# Patient Record
Sex: Male | Born: 1985 | Race: White | Hispanic: No | Marital: Single | Smoking: Current every day smoker
Health system: Southern US, Community
[De-identification: ages and names within clinical notes are randomized; demographics above are authoritative.]

---

## 2010-01-12 ENCOUNTER — Inpatient Hospital Stay: Payer: Self-pay | Admitting: Internal Medicine

## 2010-05-09 ENCOUNTER — Emergency Department: Payer: Self-pay | Admitting: Emergency Medicine

## 2010-12-16 ENCOUNTER — Emergency Department: Payer: Self-pay | Admitting: Unknown Physician Specialty

## 2011-01-23 ENCOUNTER — Inpatient Hospital Stay: Payer: Self-pay | Admitting: Otolaryngology

## 2012-10-23 ENCOUNTER — Emergency Department: Payer: Self-pay | Admitting: Emergency Medicine

## 2013-08-19 ENCOUNTER — Emergency Department: Payer: Self-pay | Admitting: Emergency Medicine

## 2013-08-19 LAB — URINALYSIS, COMPLETE
BILIRUBIN, UR: NEGATIVE
BLOOD: NEGATIVE
Bacteria: NONE SEEN
GLUCOSE, UR: NEGATIVE mg/dL (ref 0–75)
KETONE: NEGATIVE
Nitrite: NEGATIVE
PH: 7 (ref 4.5–8.0)
Protein: NEGATIVE
RBC,UR: 1 /HPF (ref 0–5)
SPECIFIC GRAVITY: 1.02 (ref 1.003–1.030)
Squamous Epithelial: <1

## 2013-08-19 LAB — GC/CHLAMYDIA PROBE AMP

## 2014-06-06 ENCOUNTER — Emergency Department
Admit: 2014-06-06 | Disposition: A | Discharge status: Home / Self Care | Payer: Self-pay | Admitting: Physician Assistant

## 2015-01-15 ENCOUNTER — Emergency Department
Admission: EM | Admit: 2015-01-15 | Discharge: 2015-01-15 | Disposition: A | Discharge status: Home / Self Care | Payer: Self-pay | Attending: Emergency Medicine | Admitting: Emergency Medicine

## 2015-01-15 DIAGNOSIS — K029 Dental caries, unspecified: Secondary | ICD-10-CM | POA: Insufficient documentation

## 2015-01-15 DIAGNOSIS — K047 Periapical abscess without sinus: Secondary | ICD-10-CM | POA: Insufficient documentation

## 2015-01-15 DIAGNOSIS — F17200 Nicotine dependence, unspecified, uncomplicated: Secondary | ICD-10-CM | POA: Insufficient documentation

## 2015-01-15 MED ORDER — OXYCODONE-ACETAMINOPHEN 5-325 MG PO TABS: 1.0000 | ORAL_TABLET | Freq: Four times a day (QID) | ORAL | Status: DC | PRN

## 2015-01-15 MED ORDER — CLINDAMYCIN PHOSPHATE 900 MG/6ML IJ SOLN
600.0000 mg | Freq: Once | INTRAMUSCULAR | Status: AC
  Administered 2015-01-15: 600 mg via INTRAMUSCULAR
  Filled 2015-01-15: qty 6

## 2015-01-15 MED ORDER — IBUPROFEN 800 MG PO TABS: 800.0000 mg | ORAL_TABLET | Freq: Three times a day (TID) | ORAL | Status: AC | PRN

## 2015-01-15 MED ORDER — OXYCODONE-ACETAMINOPHEN 5-325 MG PO TABS
1.0000 | ORAL_TABLET | Freq: Once | ORAL | Status: AC
  Administered 2015-01-15: 1 via ORAL
  Filled 2015-01-15: qty 1

## 2015-01-15 MED ORDER — CLINDAMYCIN HCL 150 MG PO CAPS: 450.0000 mg | ORAL_CAPSULE | Freq: Four times a day (QID) | ORAL | Status: AC

## 2015-01-15 NOTE — ED Provider Notes (Signed)
CSN: [id number]     Arrival date & time 01/15/15  1727 History   First MD Initiated Contact with Patient 01/15/15 1759     Chief Complaint  Patient presents with  . Dental Pain     (Consider location/radiation/quality/duration/timing/severity/associated sxs/prior Treatment) HPI  29 year old male presents to emergency department for evaluation of dental pain. Patient states he's had chronic dental infections, last infection was one year ago, needing admission. Patient's current symptoms began 2 days ago, he developed right-sided facial pain and swelling. He denies any current fevers but has had subjective fevers earlier today. Patient is tolerating by mouth well. Patient's pain is moderate to severe. He is not taking any medications. He has not been on recent antibiotics. He does not have a dentist.  History reviewed. No pertinent past medical history. History reviewed. No pertinent past surgical history. No family history on file. Social History  Substance Use Topics  . Smoking status: Current Every Day Smoker  . Smokeless tobacco: None  . Alcohol Use: No    Review of Systems  Constitutional: Negative.  Negative for fever, chills, activity change and appetite change.  HENT: Positive for dental problem. Negative for congestion, ear pain, mouth sores, rhinorrhea, sinus pressure, sore throat and trouble swallowing.   Eyes: Negative for photophobia, pain and discharge.  Respiratory: Negative for cough, chest tightness and shortness of breath.   Cardiovascular: Negative for chest pain and leg swelling.  Gastrointestinal: Negative for nausea, vomiting, abdominal pain, diarrhea and abdominal distention.  Genitourinary: Negative for dysuria and difficulty urinating.  Musculoskeletal: Negative for back pain, arthralgias and gait problem.  Skin: Negative for color change and rash.  Neurological: Negative for dizziness and headaches.  Hematological: Negative for adenopathy.    Psychiatric/Behavioral: Negative for behavioral problems and agitation.      Allergies  Review of patient's allergies indicates no known allergies.  Home Medications   Prior to Admission medications   Medication Sig Start Date End Date Taking? Authorizing Provider  clindamycin (CLEOCIN) 150 MG capsule Take 3 capsules (450 mg total) by mouth 4 (four) times daily. 01/15/15 01/25/15  Evon Slackhomas C Gaines, PA-C  ibuprofen (ADVIL,MOTRIN) 800 MG tablet Take 1 tablet (800 mg total) by mouth every 8 (eight) hours as needed. 01/15/15   Evon Slackhomas C Gaines, PA-C  oxyCODONE-acetaminophen (ROXICET) 5-325 MG tablet Take 1-2 tablets by mouth every 6 (six) hours as needed for severe pain. 01/15/15   Evon Slackhomas C Gaines, PA-C   BP 127/89 mmHg  Pulse 91  Temp(Src) 99.7 F (37.6 C) (Oral)  Resp 18  Ht 5\' 8"  (1.727 m)  Wt 58.968 kg  BMI 19.77 kg/m2  SpO2 95% Physical Exam  Constitutional: He is oriented to person, place, and time. He appears well-developed and well-nourished. No distress.  HENT:  Head: Normocephalic and atraumatic.  Right Ear: External ear normal.  Left Ear: External ear normal.  Nose: Nose normal.  Mouth/Throat: Uvula is midline and oropharynx is clear and moist. No oral lesions. No trismus in the jaw. Normal dentition. Dental abscesses and dental caries present. No uvula swelling. No oropharyngeal exudate, posterior oropharyngeal edema or posterior oropharyngeal erythema.    Eyes: Conjunctivae and EOM are normal. Pupils are equal, round, and reactive to light.  Neck: Normal range of motion. Neck supple.  Cardiovascular: Normal rate, regular rhythm, normal heart sounds and intact distal pulses.  Exam reveals no gallop and no friction rub.   No murmur heard. Pulmonary/Chest: Effort normal. No respiratory distress.  Musculoskeletal: Normal range of motion.  He exhibits no edema or tenderness.  Neurological: He is alert and oriented to person, place, and time.  Skin: Skin is warm and dry.   Psychiatric: He has a normal mood and affect. His behavior is normal. Judgment and thought content normal.    ED Course  Procedures (including critical care time) Labs Review Labs Reviewed - No data to display  Imaging Review No results found. I have personally reviewed and evaluated these images and lab results as part of my medical decision-making.   EKG Interpretation None      MDM   Final diagnoses:  Chronic dental infection    29 year old male with 2 days of right-sided facial pain and dental pain. Vital signs are stable, afebrile. Tolerating by mouth well. On exam no signs of fluctuance, mild soft tissue swelling. We'll give clindamycin 6 mg IM 1. Will be treated with 10 days of clindamycin 450 mg 4 times a day. Ibuprofen 800 mg 3 times a day. Percocet 5-3 25, one tablet by mouth every 4-6 hours when necessary pain quantity #25. Patient will follow-up with dental clinic. Discussed with patient and mother concerns/red flags for returning to the ER for such as increased pain, swelling, fevers, difficulty swallowing.  Evon Slack, PA-C 01/15/15 1858  Evon Slack, PA-C 01/15/15 1859  Jeanmarie Plant, MD 01/15/15 2007

## 2015-01-15 NOTE — Discharge Instructions (Signed)
Periodontal Disease Periodontal disease, or gum disease, is a type of oral disease that affects the surrounding and supporting tissues of the teeth. These include the gums (gingivae), ligaments, and tooth socket (alveolar bone). Periodontal disease can affect one tooth or many teeth. If left untreated, it may lead to tooth loss.  CAUSES The main cause of periodontal disease is dental plaque, which contains harmful bacteria. These bacteria can cause the gums to become inflamed and infected. Further progression of the disease can damage the other supporting tissues.  RISK FACTORS  Diabetes.   Smoking and tobacco use.   Genetics.   Hormonal changes of puberty, menopause, and pregnancy.   Stress.   Clenching or grinding your teeth.   Substance abuse.  Poor nutrition.   Diseases that interfere with the body's immune system.   Certain medicines. SIGNS AND SYMPTOMS  Red or swollen gums.  Bad breath that does not go away.  Gums that have pulled away from the teeth.  Gums that bleed easily.  Permanent teeth that are loose or separating.  Pain when chewing.  Changes in the way your teeth fit together.  Sensitive teeth. DIAGNOSIS  A thorough examination of the periodontal tissues will be done by your dentist. X-rays may be needed. Evaluation of your medical history will be needed to see if there are other factors or underlying conditions that may contribute to the disease. TREATMENT The number and types of treatment will vary depending on the extent of the disease. Treatment may include brushing and flossing only. Further disease progression may necessitate scaling and root planing or even surgery. The main goal is to control the infection. Good oral hygiene at home is necessary for the success of all types of treatment. HOME CARE INSTRUCTIONS   Practice good oral hygiene. This includes flossing and brushing your teeth every day.   See your dentist regularly, at least  2 times per year.   Stop smoking if you smoke.  Eat a well-balanced diet. SEEK IMMEDIATE DENTAL CARE IF:   You have any signs or symptoms of periodontal disease along with:  Swelling of your face, neck, or jaw.  Inability to open your mouth.  Severe pain uncontrolled by pain medicine.  You have a fever or persistent symptoms for more than 2-3 days.  You have a fever and your symptoms suddenly get worse.   This information is not intended to replace advice given to you by your health care provider. Make sure you discuss any questions you have with your health care provider.   Document Released: 02/01/2003 Document Revised: 10/01/2012 Document Reviewed: 07/08/2012 Elsevier Interactive Patient Education [employer]2016 Elsevier Inc.

## 2015-01-15 NOTE — ED Notes (Signed)
Pt c/o right toothache with swelling since wednesday

## 2015-02-22 ENCOUNTER — Encounter: Payer: Self-pay | Admitting: *Deleted

## 2015-02-22 ENCOUNTER — Emergency Department
Admission: EM | Admit: 2015-02-22 | Discharge: 2015-02-22 | Disposition: A | Discharge status: Home / Self Care | Payer: PRIVATE HEALTH INSURANCE | Attending: Emergency Medicine | Admitting: Emergency Medicine

## 2015-02-22 DIAGNOSIS — F17200 Nicotine dependence, unspecified, uncomplicated: Secondary | ICD-10-CM | POA: Diagnosis not present

## 2015-02-22 DIAGNOSIS — L0201 Cutaneous abscess of face: Secondary | ICD-10-CM | POA: Insufficient documentation

## 2015-02-22 DIAGNOSIS — K0499 Other diseases of pulp and periapical tissues: Secondary | ICD-10-CM | POA: Insufficient documentation

## 2015-02-22 DIAGNOSIS — K0381 Cracked tooth: Secondary | ICD-10-CM | POA: Insufficient documentation

## 2015-02-22 LAB — CBC WITH DIFFERENTIAL/PLATELET
BASOS PCT: 1 %
Basophils Absolute: 0.1 10*3/uL (ref 0–0.1)
EOS ABS: 0.1 10*3/uL (ref 0–0.7)
EOS PCT: 1 %
HEMATOCRIT: 38 % — AB (ref 40.0–52.0)
Hemoglobin: 12.8 g/dL — ABNORMAL LOW (ref 13.0–18.0)
Lymphocytes Relative: 15 %
Lymphs Abs: 2.8 10*3/uL (ref 1.0–3.6)
MCH: 30.9 pg (ref 26.0–34.0)
MCHC: 33.7 g/dL (ref 32.0–36.0)
MCV: 91.6 fL (ref 80.0–100.0)
MONO ABS: 1.2 10*3/uL — AB (ref 0.2–1.0)
MONOS PCT: 6 %
Neutro Abs: 15 10*3/uL — ABNORMAL HIGH (ref 1.4–6.5)
Neutrophils Relative %: 77 %
PLATELETS: 336 10*3/uL (ref 150–440)
RBC: 4.15 MIL/uL — ABNORMAL LOW (ref 4.40–5.90)
RDW: 13.7 % (ref 11.5–14.5)
WBC: 19.2 10*3/uL — ABNORMAL HIGH (ref 3.8–10.6)

## 2015-02-22 LAB — BASIC METABOLIC PANEL
Anion gap: 7 (ref 5–15)
BUN: 10 mg/dL (ref 6–20)
CALCIUM: 8.8 mg/dL — AB (ref 8.9–10.3)
CO2: 26 mmol/L (ref 22–32)
CREATININE: 0.69 mg/dL (ref 0.61–1.24)
Chloride: 109 mmol/L (ref 101–111)
GFR calc non Af Amer: >60 mL/min (ref >60)
Glucose, Bld: 86 mg/dL (ref 65–99)
Potassium: 3.8 mmol/L (ref 3.5–5.1)
SODIUM: 142 mmol/L (ref 135–145)

## 2015-02-22 MED ORDER — OXYCODONE-ACETAMINOPHEN 7.5-325 MG PO TABS: 1.0000 | ORAL_TABLET | Freq: Four times a day (QID) | ORAL | Status: DC | PRN

## 2015-02-22 MED ORDER — CLINDAMYCIN PHOSPHATE 600 MG/50ML IV SOLN
600.0000 mg | Freq: Once | INTRAVENOUS | Status: AC
  Administered 2015-02-22: 600 mg via INTRAVENOUS
  Filled 2015-02-22: qty 50

## 2015-02-22 MED ORDER — LIDOCAINE-EPINEPHRINE (PF) 1 %-1:200000 IJ SOLN
INTRAMUSCULAR | Status: AC
  Filled 2015-02-22: qty 30

## 2015-02-22 MED ORDER — HYDROMORPHONE HCL 1 MG/ML IJ SOLN
1.0000 mg | Freq: Once | INTRAMUSCULAR | Status: AC
  Administered 2015-02-22: 1 mg via INTRAMUSCULAR
  Filled 2015-02-22: qty 1

## 2015-02-22 MED ORDER — CLINDAMYCIN HCL 150 MG PO CAPS: 150.0000 mg | ORAL_CAPSULE | Freq: Four times a day (QID) | ORAL | Status: AC

## 2015-02-22 NOTE — ED Notes (Signed)
States he has a tooth abscess to right lower gumline. And also has a large abscess area to jaw area .The patient has a history of of same

## 2015-02-22 NOTE — ED Provider Notes (Signed)
[facility] Emergency Department Provider Note  ____________________________________________  Time seen: Approximately 3:08 PM  I have reviewed the triage vital signs and the nursing notes.   HISTORY  Chief Complaint Abscess and Dental Pain    HPI Martin Curtis is a 30 y.o. male patient complaining of swollen area to the right lower lateral mandible. Patient has a history of fractured and infected teeth has not follow-up with the dentist as directed. Patient stated apply warm compresses to the area and it has risen to the size of a golf ball at this time. Patient state is painful but no other palliative measures taken. Patient rates the pain as a 5/10 describe as painful pressure sensation. No drainage from the area at this time. Patient denies any fever.   History reviewed. No pertinent past medical history.  There are no active problems to display for this patient.   History reviewed. No pertinent past surgical history.  Current Outpatient Rx  Name  Route  Sig  Dispense  Refill  . clindamycin (CLEOCIN) 150 MG capsule   Oral   Take 1 capsule (150 mg total) by mouth 4 (four) times daily.   40 capsule   0   . ibuprofen (ADVIL,MOTRIN) 800 MG tablet   Oral   Take 1 tablet (800 mg total) by mouth every 8 (eight) hours as needed.   30 tablet   0   . oxyCODONE-acetaminophen (PERCOCET) 7.5-325 MG tablet   Oral   Take 1 tablet by mouth every 6 (six) hours as needed for severe pain.   12 tablet   0   . oxyCODONE-acetaminophen (ROXICET) 5-325 MG tablet   Oral   Take 1-2 tablets by mouth every 6 (six) hours as needed for severe pain.   25 tablet   0     Allergies Review of patient's allergies indicates no known allergies.  History reviewed. No pertinent family history.  Social History Social History  Substance Use Topics  . Smoking status: Current Every Day Smoker  . Smokeless tobacco: None  . Alcohol Use: No    Review of  Systems Constitutional: No fever/chills Eyes: No visual changes. ENT: No sore throat. Cardiovascular: Denies chest pain. Respiratory: Denies shortness of breath. Gastrointestinal: No abdominal pain.  No nausea, no vomiting.  No diarrhea.  No constipation. Genitourinary: Negative for dysuria. Musculoskeletal: Negative for back pain. Skin: Negative for rash. Swollen area right lateral mandible Neurological: Negative for headaches, focal weakness or numbness.  10-point ROS otherwise negative.  ____________________________________________   PHYSICAL EXAM:  VITAL SIGNS: ED Triage Vitals  Enc Vitals Group     BP 02/22/15 1331 135/82 mmHg     Pulse Rate 02/22/15 1331 79     Resp 02/22/15 1331 18     Temp 02/22/15 1331 97.8 F (36.6 C)     Temp Source 02/22/15 1331 Oral     SpO2 02/22/15 1331 98 %     Weight 02/22/15 1331 140 lb (63.504 kg)     Height 02/22/15 1331 5\' 8"  (1.727 m)     Head Cir --      Peak Flow --      Pain Score 02/22/15 1331 5     Pain Loc --      Pain Edu? --      Excl. in GC? --     Constitutional: Alert and oriented. Well appearing and in no acute distress. Eyes: Conjunctivae are normal. PERRL. EOMI. Head: Atraumatic. Nose: No congestion/rhinnorhea. Mouth/Throat: Mucous membranes  are moist.  Oropharynx non-erythematous. Fractured and devitalized teeth upper and right lower molar area Neck: No stridor.  No cervical spine tenderness to palpation. Hematological/Lymphatic/Immunilogical: No cervical lymphadenopathy. Cardiovascular: Normal rate, regular rhythm. Grossly normal heart sounds.  Good peripheral circulation. Respiratory: Normal respiratory effort.  No retractions. Lungs CTAB. Gastrointestinal: Soft and nontender. No distention. No abdominal bruits. No CVA tenderness. Musculoskeletal: No lower extremity tenderness nor edema.  No joint effusions. Neurologic:  Normal speech and language. No gross focal neurologic deficits are appreciated. No gait  instability. Skin:  Skin is warm, dry and intact. No rash noted. Abscess right lower lateral mandible area  Psychiatric: Mood and affect are normal. Speech and behavior are normal.  ____________________________________________   LABS (all labs ordered are listed, but only abnormal results are displayed)  Labs Reviewed  CBC WITH DIFFERENTIAL/PLATELET - Abnormal; Notable for the following:    WBC 19.2 (*)    RBC 4.15 (*)    Hemoglobin 12.8 (*)    HCT 38.0 (*)    Neutro Abs 15.0 (*)    Monocytes Absolute 1.2 (*)    All other components within normal limits  BASIC METABOLIC PANEL - Abnormal; Notable for the following:    Calcium 8.8 (*)    All other components within normal limits  WOUND CULTURE   ____________________________________________  EKG   ____________________________________________  RADIOLOGY   ____________________________________________   PROCEDURES  Procedure(s) performed: See procedure note  Critical Care performed: No  ________________INCISION AND DRAINAGE Performed by: Joni Reining Consent: Verbal consent obtained. Risks and benefits: risks, benefits and alternatives were discussed Type: abscess  Body area: Right lower mandible area  Anesthesia: local infiltration  Incision was made with a scalpel.  Local anesthetic: lidocaine 1% with epinephrine  Anesthetic total: 6 ml  Complexity: complex Blunt dissection to break up loculations  Drainage: purulent  Drainage amount: Large Packing material: 1/4 in iodoform gauze Patient tolerance: Patient tolerated the procedure well with no immediate complications.   ____________________________   INITIAL IMPRESSION / ASSESSMENT AND PLAN / ED COURSE  Pertinent labs & imaging results that were available during my care of the patient were reviewed by me and considered in my medical decision making (see chart for details). Cutaneous abscess right lower mandible area. Area was I&D. Patient started  on clindamycin 600 mg IV followed by oral medication for 10 days. Patient advised return back in 2 days for wound check and removal of packing.  ____________________________________________   FINAL CLINICAL IMPRESSION(S) / ED DIAGNOSES  Final diagnoses:  Cutaneous abscess of face      Joni Reining, PA-C 02/22/15 1849  Jeanmarie Plant, MD 02/22/15 2200

## 2015-02-22 NOTE — Discharge Instructions (Signed)
Percutaneous Abscess Drain, Care After °Refer to this sheet in the next few weeks. These instructions provide you with information on caring for yourself after your procedure. Your health care provider may also give you more specific instructions. Your treatment has been planned according to current medical practices, but problems sometimes occur. Call your health care provider if you have any problems or questions after your procedure. °WHAT TO EXPECT AFTER THE PROCEDURE °After your procedure, it is typical to have the following:  °· A small amount of discomfort in the area where the drainage tube was placed. °· A small amount of bruising around the area where the drainage tube was placed. °· Sleepiness and fatigue for the rest of the day from the medicines used. °HOME CARE INSTRUCTIONS °· Rest at home for 1-2 days following your procedure or as directed by your health care provider. °· If you go home right after the procedure, plan to have someone with you for 24 hours. °· Do not take a bath or shower for 24 hours after your procedure. °· Take medicines only as directed by your health care provider. Ask your health care provider when you can resume taking any normal medicines. °· Change bandages (dressings) as directed.   °· You may be told to record the amount of drainage from the bag every time you empty it. Follow your health care provider's directions for emptying the bag. Write down the amount of drainage, the date, and the time you emptied it. °· Call your health care provider when the drain is putting out less than 10 mL of drainage per day for 2-3 days in a row or as directed by your health care provider. °· Follow your health care provider's instructions for cleaning the drainage tube. You may need to clean the tube every day so that it does not clog. °SEEK MEDICAL CARE IF: °· You have increased bleeding (more than a small spot) from the site where the drainage tube was placed. °· You have redness,  swelling, or increasing pain around the site where the drainage tube was placed. °· You notice a discharge or bad smell coming from the site where the drainage tube was placed. °· You have a fever or chills.  °· You have pain that is not helped by medicine.   °SEEK IMMEDIATE MEDICAL CARE IF: °· There is leakage around the drainage tube. °· The drainage tube pulls out. °· You suddenly stop having drainage from the tube. °· You suddenly have blood in the drainage fluid. °· You become dizzy or faint. °· You develop a rash.   °· You have nausea or vomiting. °· You have difficulty breathing, feel short of breath, or feel faint.   °· You develop chest pain. °· You have problems with your speech or vision. °· You have trouble balancing or moving your arms or legs. °  °This information is not intended to replace advice given to you by your health care provider. Make sure you discuss any questions you have with your health care provider. °  °Document Released: 06/15/2013 Document Revised: 11/17/2013 Document Reviewed: 06/15/2013 °Elsevier Interactive Patient Education ©2016 Elsevier Inc. ° °

## 2015-02-22 NOTE — ED Notes (Signed)
Pt states broken tooth on right side, states abscess comes and goes but for several days increased swelling

## 2015-02-24 ENCOUNTER — Emergency Department
Admission: EM | Admit: 2015-02-24 | Discharge: 2015-02-24 | Disposition: A | Discharge status: Home / Self Care | Payer: PRIVATE HEALTH INSURANCE | Attending: Emergency Medicine | Admitting: Emergency Medicine

## 2015-02-24 ENCOUNTER — Encounter: Payer: Self-pay | Admitting: Medical Oncology

## 2015-02-24 DIAGNOSIS — Z792 Long term (current) use of antibiotics: Secondary | ICD-10-CM | POA: Insufficient documentation

## 2015-02-24 DIAGNOSIS — F17200 Nicotine dependence, unspecified, uncomplicated: Secondary | ICD-10-CM | POA: Diagnosis not present

## 2015-02-24 DIAGNOSIS — Z5189 Encounter for other specified aftercare: Secondary | ICD-10-CM

## 2015-02-24 DIAGNOSIS — Z4801 Encounter for change or removal of surgical wound dressing: Secondary | ICD-10-CM | POA: Insufficient documentation

## 2015-02-24 MED ORDER — OXYCODONE-ACETAMINOPHEN 5-325 MG PO TABS
1.0000 | ORAL_TABLET | Freq: Once | ORAL | Status: AC
  Administered 2015-02-24: 1 via ORAL
  Filled 2015-02-24: qty 1

## 2015-02-24 NOTE — Discharge Instructions (Signed)
Return to the emergency room for recheck and packing removal.

## 2015-02-24 NOTE — ED Notes (Signed)
Pt was seen here 2 days ago and had packing placed to face, here for re-check.

## 2015-02-24 NOTE — ED Provider Notes (Signed)
[facility] Emergency Department Provider Note ____________________________________________  Time seen: Approximately 2:32 PM  I have reviewed the triage vital signs and the nursing notes.   HISTORY  Chief Complaint Wound Check    HPI Martin Curtis is a 30 y.o. male is here for a recheck. He was seen here on 1/12 for an abscess to the right side of his face. Area was I&D and packing was placed. Patient states that the area feels much better. He continues to take antibiotics without any difficulty.   History reviewed. No pertinent past medical history.  There are no active problems to display for this patient.   History reviewed. No pertinent past surgical history.  Current Outpatient Rx  Name  Route  Sig  Dispense  Refill  . clindamycin (CLEOCIN) 150 MG capsule   Oral   Take 1 capsule (150 mg total) by mouth 4 (four) times daily.   40 capsule   0   . ibuprofen (ADVIL,MOTRIN) 800 MG tablet   Oral   Take 1 tablet (800 mg total) by mouth every 8 (eight) hours as needed.   30 tablet   0   . oxyCODONE-acetaminophen (PERCOCET) 7.5-325 MG tablet   Oral   Take 1 tablet by mouth every 6 (six) hours as needed for severe pain.   12 tablet   0   . oxyCODONE-acetaminophen (ROXICET) 5-325 MG tablet   Oral   Take 1-2 tablets by mouth every 6 (six) hours as needed for severe pain.   25 tablet   0     Allergies Review of patient's allergies indicates no known allergies.  No family history on file.  Social History Social History  Substance Use Topics  . Smoking status: Current Every Day Smoker  . Smokeless tobacco: None  . Alcohol Use: No    Review of Systems Constitutional: No fever/chills Cardiovascular: Denies chest pain. Respiratory: Denies shortness of breath. Gastrointestinal:  No nausea, no vomiting.  Musculoskeletal: Negative for back pain. Skin: Positive abscess. Neurological: Negative for headaches  10-point ROS otherwise  negative.  ____________________________________________   PHYSICAL EXAM:  VITAL SIGNS: ED Triage Vitals  Enc Vitals Group     BP 02/24/15 1307 124/66 mmHg     Pulse Rate 02/24/15 1307 92     Resp 02/24/15 1307 12     Temp 02/24/15 1307 98 F (36.7 C)     Temp Source 02/24/15 1307 Oral     SpO2 02/24/15 1307 98 %     Weight 02/24/15 1307 140 lb (63.504 kg)     Height 02/24/15 1307 5\' 8"  (1.727 m)     Head Cir --      Peak Flow --      Pain Score --      Pain Loc --      Pain Edu? --      Excl. in GC? --     Constitutional: Alert and oriented. Well appearing and in no acute distress. Eyes: Conjunctivae are normal. PERRL. EOMI. Head: Atraumatic. Nose: No congestion/rhinnorhea. Neck: No stridor.   Cardiovascular: Normal rate, regular rhythm. Grossly normal heart sounds.  Good peripheral circulation. Respiratory: Normal respiratory effort.  No retractions. Lungs CTAB. Gastrointestinal: Soft and nontender. No distention.  Neurologic:  Normal speech and language. No gross focal neurologic deficits are appreciated. No gait instability. Skin:  Skin is warm, dry and intact. No rash noted. Area appears to be healing without any extending cellulitis.   ____________________________________________   LABS (all labs  ordered are listed, but only abnormal results are displayed)  Labs Reviewed - No data to display  PROCEDURES  Procedure(s) performed: Packing was removed without any difficulty. Area was repacked and patient is to return in 2 days for packing removal.  Critical Care performed: No  ____________________________________________   INITIAL IMPRESSION / ASSESSMENT AND PLAN / ED COURSE  Pertinent labs & imaging results that were available during my care of the patient were reviewed by me and considered in my medical decision making (see chart for details).  Patient told to return in 2 days for packing removal. ____________________________________________   FINAL  CLINICAL IMPRESSION(S) / ED DIAGNOSES  Final diagnoses:  Wound check, abscess      Tommi RumpsRhonda L Summers, PA-C 02/24/15 1516  Tommi Rumpshonda L Summers, PA-C 03/09/15 1411  Arnaldo NatalPaul F Malinda, MD 03/18/15 2332

## 2015-02-24 NOTE — ED Notes (Signed)
Assess per PA 

## 2015-02-26 LAB — WOUND CULTURE
Culture: NORMAL
Special Requests: NORMAL

## 2016-12-31 ENCOUNTER — Other Ambulatory Visit: Payer: Self-pay

## 2016-12-31 ENCOUNTER — Emergency Department
Admission: EM | Admit: 2016-12-31 | Discharge: 2016-12-31 | Disposition: A | Discharge status: Home / Self Care | Payer: Commercial Managed Care - HMO | Attending: Emergency Medicine | Admitting: Emergency Medicine

## 2016-12-31 ENCOUNTER — Emergency Department: Payer: Commercial Managed Care - HMO

## 2016-12-31 ENCOUNTER — Encounter: Payer: Self-pay | Admitting: Emergency Medicine

## 2016-12-31 DIAGNOSIS — N132 Hydronephrosis with renal and ureteral calculous obstruction: Secondary | ICD-10-CM | POA: Diagnosis not present

## 2016-12-31 DIAGNOSIS — N200 Calculus of kidney: Secondary | ICD-10-CM

## 2016-12-31 DIAGNOSIS — F17200 Nicotine dependence, unspecified, uncomplicated: Secondary | ICD-10-CM | POA: Insufficient documentation

## 2016-12-31 DIAGNOSIS — R319 Hematuria, unspecified: Secondary | ICD-10-CM | POA: Diagnosis present

## 2016-12-31 LAB — CBC
HCT: 40.8 % (ref 40.0–52.0)
Hemoglobin: 14.3 g/dL (ref 13.0–18.0)
MCH: 32.2 pg (ref 26.0–34.0)
MCHC: 35 g/dL (ref 32.0–36.0)
MCV: 92 fL (ref 80.0–100.0)
Platelets: 411 10*3/uL (ref 150–440)
RBC: 4.43 MIL/uL (ref 4.40–5.90)
RDW: 13 % (ref 11.5–14.5)
WBC: 11.2 10*3/uL — ABNORMAL HIGH (ref 3.8–10.6)

## 2016-12-31 LAB — COMPREHENSIVE METABOLIC PANEL
ALT: 14 U/L — ABNORMAL LOW (ref 17–63)
AST: 19 U/L (ref 15–41)
Albumin: 4 g/dL (ref 3.5–5.0)
Alkaline Phosphatase: 53 U/L (ref 38–126)
Anion gap: 8 (ref 5–15)
BUN: 12 mg/dL (ref 6–20)
CALCIUM: 9.2 mg/dL (ref 8.9–10.3)
CHLORIDE: 106 mmol/L (ref 101–111)
CO2: 25 mmol/L (ref 22–32)
CREATININE: 0.7 mg/dL (ref 0.61–1.24)
Glucose, Bld: 96 mg/dL (ref 65–99)
POTASSIUM: 3.9 mmol/L (ref 3.5–5.1)
SODIUM: 139 mmol/L (ref 135–145)
TOTAL PROTEIN: 7.4 g/dL (ref 6.5–8.1)
Total Bilirubin: 0.6 mg/dL (ref 0.3–1.2)

## 2016-12-31 LAB — URINALYSIS, COMPLETE (UACMP) WITH MICROSCOPIC
BILIRUBIN URINE: NEGATIVE
Bacteria, UA: NONE SEEN
Glucose, UA: NEGATIVE mg/dL
Ketones, ur: NEGATIVE mg/dL
LEUKOCYTES UA: NEGATIVE
NITRITE: NEGATIVE
PH: 5 (ref 5.0–8.0)
Protein, ur: 30 mg/dL — AB
SPECIFIC GRAVITY, URINE: 1.021 (ref 1.005–1.030)

## 2016-12-31 LAB — LIPASE, BLOOD: Lipase: 36 U/L (ref 11–51)

## 2016-12-31 MED ORDER — KETOROLAC TROMETHAMINE 10 MG PO TABS: 10.0000 mg | ORAL_TABLET | Freq: Four times a day (QID) | ORAL | 0 refills | Status: AC | PRN

## 2016-12-31 MED ORDER — TAMSULOSIN HCL 0.4 MG PO CAPS: 0.4000 mg | ORAL_CAPSULE | Freq: Every day | ORAL | 0 refills | Status: AC

## 2016-12-31 NOTE — ED Triage Notes (Signed)
Pt presents with urinary pain, frequency, hematuria, and recent left testicle pain. Urinary symptoms started this morning. He isn't sure that the testicle swelling and pain is related, as it has resolved. Pt alert & oriented with nad noted.

## 2016-12-31 NOTE — ED Provider Notes (Signed)
[facility] Emergency Department Provider Note  Time seen: 7:33 PM  I have reviewed the triage vital signs and the nursing notes.   HISTORY  Chief Complaint Abdominal Pain    HPI Martin Curtis is a 31 y.o. male with no past medical history who presents to the emergency department with hematuria, urinary frequency and left flank pain.  Patient states this morning while driving to work he had sudden onset acute left-sided abdominal pain that radiated into his left testicle.  Patient states he had to urinate very frequently and states mild dysuria when urinating.  States this morning he saw blood in his urine but has not seen any blood since.  Denies any history of kidney stones.  Denies any nausea, vomiting or diarrhea.  Denies any known fever.  History reviewed. No pertinent past medical history.  There are no active problems to display for this patient.   History reviewed. No pertinent surgical history.  Prior to Admission medications   Medication Sig Start Date End Date Taking? Authorizing Provider  clindamycin (CLEOCIN) 150 MG capsule Take 1 capsule (150 mg total) by mouth 4 (four) times daily. 02/22/15   Joni ReiningSmith, Ronald K, PA-C  ibuprofen (ADVIL,MOTRIN) 800 MG tablet Take 1 tablet (800 mg total) by mouth every 8 (eight) hours as needed. 01/15/15   Evon SlackGaines, Thomas C, PA-C  oxyCODONE-acetaminophen (PERCOCET) 7.5-325 MG tablet Take 1 tablet by mouth every 6 (six) hours as needed for severe pain. 02/22/15   Joni ReiningSmith, Ronald K, PA-C  oxyCODONE-acetaminophen (ROXICET) 5-325 MG tablet Take 1-2 tablets by mouth every 6 (six) hours as needed for severe pain. 01/15/15   Evon SlackGaines, Thomas C, PA-C    No Known Allergies  History reviewed. No pertinent family history.  Social History Social History   Tobacco Use  . Smoking status: Current Every Day Smoker    Packs/day: 1.50  . Smokeless tobacco: Never Used  Substance Use Topics  . Alcohol use: No  . Drug use: Yes    Types:  Marijuana    Comment: occasional    Review of Systems Constitutional: Negative for fever. Cardiovascular: Negative for chest pain. Respiratory: Negative for shortness of breath. Gastrointestinal: Positive for left flank pain.  Negative for nausea vomiting or diarrhea Genitourinary: Mild dysuria, positive for hematuria now resolved Musculoskeletal: Left back pain All other ROS negative  ____________________________________________   PHYSICAL EXAM:  VITAL SIGNS: ED Triage Vitals  Enc Vitals Group     BP 12/31/16 1828 120/80     Pulse Rate 12/31/16 1828 91     Resp 12/31/16 1828 18     Temp 12/31/16 1828 99.4 F (37.4 C)     Temp Source 12/31/16 1828 Oral     SpO2 12/31/16 1828 100 %     Weight 12/31/16 1828 140 lb (63.5 kg)     Height 12/31/16 1828 5\' 8"  (1.727 m)     Head Circumference --      Peak Flow --      Pain Score 12/31/16 1827 2     Pain Loc --      Pain Edu? --      Excl. in GC? --    Constitutional: Alert and oriented. Well appearing and in no distress. Eyes: Normal exam ENT   Head: Normocephalic and atraumatic   Mouth/Throat: Mucous membranes are moist. Cardiovascular: Normal rate, regular rhythm. No murmur Respiratory: Normal respiratory effort without tachypnea nor retractions. Breath sounds are clear Gastrointestinal: Soft and nontender. No distention.  No  CVA tenderness Musculoskeletal: Nontender with normal range of motion in all extremities Neurologic:  Normal speech and language. No gross focal neurologic deficits Skin:  Skin is warm, dry and intact.  Psychiatric: Mood and affect are normal.   ____________________________________________   RADIOLOGY  Left 2 mm UVJ stone  ____________________________________________   INITIAL IMPRESSION / ASSESSMENT AND PLAN / ED COURSE  Pertinent labs & imaging results that were available during my care of the patient were reviewed by me and considered in my medical decision making (see chart for  details).  Patient presents to the emergency department for left-sided flank pain with hematuria.  Differential would include ureterolithiasis, urinary tract infection, pyelonephritis.  Patient symptoms are very suggestive of ureterolithiasis.  No history of ureterolithiasis.  We will obtain a noncontrasted CT scan to further evaluate.  Patient's workup is fairly reassuring, labs are within normal limits besides a slightly elevated white blood cell count.  Urinalysis pending.  CT shows 2 mm left UVJ stone. Urinalysis has resulted normal.  We will discharge the patient with oral Toradol, Flomax.  I discussed with the patient drinking plenty of fluids as well as my normal kidneys on return precautions. ____________________________________________   FINAL CLINICAL IMPRESSION(S) / ED DIAGNOSES  Theda SersUreterolithiasis    Martin Sitzman, MD 12/31/16 2106

## 2017-02-12 ENCOUNTER — Other Ambulatory Visit: Payer: Self-pay

## 2017-02-12 ENCOUNTER — Emergency Department
Admission: EM | Admit: 2017-02-12 | Discharge: 2017-02-13 | Disposition: A | Discharge status: Home / Self Care | Payer: Commercial Managed Care - HMO | Attending: Emergency Medicine | Admitting: Emergency Medicine

## 2017-02-12 DIAGNOSIS — K047 Periapical abscess without sinus: Secondary | ICD-10-CM | POA: Diagnosis not present

## 2017-02-12 DIAGNOSIS — F17210 Nicotine dependence, cigarettes, uncomplicated: Secondary | ICD-10-CM | POA: Diagnosis not present

## 2017-02-12 DIAGNOSIS — K0889 Other specified disorders of teeth and supporting structures: Secondary | ICD-10-CM | POA: Diagnosis present

## 2017-02-12 NOTE — ED Notes (Signed)
Pt sitting in flex lobby with no distress noted; pt updated on wait time and delay

## 2017-02-12 NOTE — ED Triage Notes (Signed)
Pt in with co dental abscess since Saturday morning.

## 2017-02-13 ENCOUNTER — Encounter: Payer: Self-pay | Admitting: Emergency Medicine

## 2017-02-13 MED ORDER — CEPHALEXIN 500 MG PO CAPS
500.0000 mg | ORAL_CAPSULE | Freq: Once | ORAL | Status: AC
  Administered 2017-02-13: 500 mg via ORAL
  Filled 2017-02-13: qty 1

## 2017-02-13 MED ORDER — BUPIVACAINE HCL (PF) 0.5 % IJ SOLN
5.0000 mL | Freq: Once | INTRAMUSCULAR | Status: AC
  Administered 2017-02-13: 5 mL
  Filled 2017-02-13: qty 30

## 2017-02-13 MED ORDER — CEPHALEXIN 500 MG PO CAPS: 500.0000 mg | ORAL_CAPSULE | Freq: Four times a day (QID) | ORAL | 0 refills | Status: AC

## 2017-02-13 NOTE — ED Provider Notes (Signed)
[facility] Emergency Department Provider Note   ____________________________________________   First MD Initiated Contact with Patient 02/12/17 2340     (approximate)  I have reviewed the triage vital signs and the nursing notes.   HISTORY  Chief Complaint Abscess    HPI Martin Curtis is a 32 y.o. male who comes into the hospital today with an abscess on his gums.  The patient states that he noticed it about 2 days ago.  He reports that he has multiple broken teeth on his maxillary jaw.  He reports that he has been doing [place] powders and Anbesol but the swelling has gotten worse.  The patient has a dentist and plans to follow-up with him.  The patient rates his pain a 2-3 out of 10 in intensity currently.  He states that he had a temperature to 100 last night.  He is here today for evaluation and treatment of his abscess.  History reviewed. No pertinent past medical history.  There are no active problems to display for this patient.   History reviewed. No pertinent surgical history.  Prior to Admission medications   Medication Sig Start Date End Date Taking? Authorizing Provider  cephALEXin (KEFLEX) 500 MG capsule Take 1 capsule (500 mg total) by mouth 4 (four) times daily for 10 days. 02/13/17 02/23/17  Rebecka Apley, MD  clindamycin (CLEOCIN) 150 MG capsule Take 1 capsule (150 mg total) by mouth 4 (four) times daily. 02/22/15   Joni Reining, PA-C  ibuprofen (ADVIL,MOTRIN) 800 MG tablet Take 1 tablet (800 mg total) by mouth every 8 (eight) hours as needed. 01/15/15   Evon Slack, PA-C  ketorolac (TORADOL) 10 MG tablet Take 1 tablet (10 mg total) every 6 (six) hours as needed by mouth. 12/31/16   Minna Antis, MD  oxyCODONE-acetaminophen (PERCOCET) 7.5-325 MG tablet Take 1 tablet by mouth every 6 (six) hours as needed for severe pain. 02/22/15   Joni Reining, PA-C  oxyCODONE-acetaminophen (ROXICET) 5-325 MG tablet Take 1-2 tablets by  mouth every 6 (six) hours as needed for severe pain. 01/15/15   Evon Slack, PA-C  tamsulosin (FLOMAX) 0.4 MG CAPS capsule Take 1 capsule (0.4 mg total) daily by mouth. 12/31/16   Minna Antis, MD    Allergies Patient has no known allergies.  No family history on file.  Social History Social History   Tobacco Use  . Smoking status: Current Every Day Smoker    Packs/day: 1.50  . Smokeless tobacco: Never Used  Substance Use Topics  . Alcohol use: No  . Drug use: Yes    Types: Marijuana    Comment: occasional    Review of Systems  Constitutional: No fever/chills Eyes: No visual changes. ENT: Dental abscess Cardiovascular: Denies chest pain. Respiratory: Denies shortness of breath. Gastrointestinal: No abdominal pain.  No nausea, no vomiting.  No diarrhea.  No constipation. Genitourinary: Negative for dysuria. Musculoskeletal: Negative for back pain. Skin: Negative for rash. Neurological: Negative for headaches, focal weakness or numbness.   ____________________________________________   PHYSICAL EXAM:  VITAL SIGNS: ED Triage Vitals  Enc Vitals Group     BP 02/12/17 2116 (!) 151/76     Pulse Rate 02/12/17 2116 91     Resp 02/12/17 2116 20     Temp 02/12/17 2116 99 F (37.2 C)     Temp Source 02/12/17 2116 Oral     SpO2 02/12/17 2116 98 %     Weight 02/12/17 2117 145 lb (65.8 kg)  Height 02/12/17 2117 5\' 8"  (1.727 m)     Head Circumference --      Peak Flow --      Pain Score 02/12/17 2116 3     Pain Loc --      Pain Edu? --      Excl. in GC? --     Constitutional: Alert and oriented. Well appearing and in no acute distress. Eyes: Conjunctivae are normal. PERRL. EOMI. Head: Atraumatic. Nose: No congestion/rhinnorhea. Mouth/Throat: Mucous membranes are moist.  Oropharynx non-erythematous.  Soft tissue swelling to left maxillary gumline with visible abscess. Cardiovascular: Normal rate, regular rhythm. Grossly normal heart sounds.  Good  peripheral circulation. Respiratory: Normal respiratory effort.  No retractions. Lungs CTAB. Gastrointestinal: Soft and nontender. No distention.  Positive bowel sounds Musculoskeletal: No lower extremity tenderness nor edema.   Neurologic:  Normal speech and language.  Skin:  Skin is warm, dry and intact.  Psychiatric: Mood and affect are normal.   ____________________________________________   LABS (all labs ordered are listed, but only abnormal results are displayed)  Labs Reviewed - No data to display ____________________________________________  EKG  none ____________________________________________  RADIOLOGY  No results found.  ____________________________________________   PROCEDURES  Procedure(s) performed: please, see procedure note(s).  Marland Kitchen.Incision and Drainage Date/Time: 02/13/2017 12:40 AM Performed by: Rebecka Apley, MD Authorized by: Rebecka Apley, MD   Consent:    Consent obtained:  Verbal   Consent given by:  Patient   Risks discussed:  Incomplete drainage and pain Location:    Type:  Abscess   Location:  Mouth   Mouth location:  Alveolar process Anesthesia (see MAR for exact dosages):    Anesthesia method:  Local infiltration   Local anesthetic:  Bupivacaine 0.5% w/o epi Procedure type:    Complexity:  Simple Procedure details:    Incision types:  Stab incision   Scalpel blade:  11   Drainage:  Purulent   Drainage amount:  Moderate   Packing materials:  None Post-procedure details:    Patient tolerance of procedure:  Tolerated well, no immediate complications     Critical Care performed: No  ____________________________________________   INITIAL IMPRESSION / ASSESSMENT AND PLAN / ED COURSE  As part of my medical decision making, I reviewed the following data within the electronic MEDICAL RECORD NUMBER Notes from prior ED visits and [place] Controlled Substance Database   This is a 32 year old male who comes into the hospital  today with an abscess in his mouth.  The patient has multiple broken teeth and has soft tissue swelling to his maxillary gums.  I will give the patient a dose of Keflex and I did drain the abscess.  I feel that this is from his multiple broken teeth.  I did a quick stab incision with a scalpel and did drain some significant amount of purulent and bloody material.  The patient will be discharged with some antibiotics and instructed to follow-up with his dentist.  I instructed the patient that he should use the mouthwash to help cleanse his mouth and please take the antibiotics.  The patient will be discharged home.      ____________________________________________   FINAL CLINICAL IMPRESSION(S) / ED DIAGNOSES  Final diagnoses:  Dental abscess     ED Discharge Orders        Ordered    cephALEXin (KEFLEX) 500 MG capsule  4 times daily     02/13/17 0111       Note:  This document was  prepared using Conservation officer, historic buildingsDragon voice recognition software and may include unintentional dictation errors.    Rebecka ApleyWebster, Allison P, MD 02/13/17 [id number]

## 2017-02-13 NOTE — Discharge Instructions (Signed)
[facility] or at your own dentist.

## 2018-10-28 ENCOUNTER — Emergency Department: Payer: Self-pay

## 2018-10-28 ENCOUNTER — Emergency Department
Admission: EM | Admit: 2018-10-28 | Discharge: 2018-10-28 | Disposition: A | Discharge status: Home / Self Care | Payer: Self-pay | Attending: Emergency Medicine | Admitting: Emergency Medicine

## 2018-10-28 ENCOUNTER — Encounter: Payer: Self-pay | Admitting: Emergency Medicine

## 2018-10-28 ENCOUNTER — Other Ambulatory Visit: Payer: Self-pay

## 2018-10-28 DIAGNOSIS — Y999 Unspecified external cause status: Secondary | ICD-10-CM | POA: Diagnosis not present

## 2018-10-28 DIAGNOSIS — Y9389 Activity, other specified: Secondary | ICD-10-CM | POA: Diagnosis not present

## 2018-10-28 DIAGNOSIS — R0789 Other chest pain: Secondary | ICD-10-CM | POA: Diagnosis not present

## 2018-10-28 DIAGNOSIS — F17210 Nicotine dependence, cigarettes, uncomplicated: Secondary | ICD-10-CM | POA: Insufficient documentation

## 2018-10-28 DIAGNOSIS — Y92410 Unspecified street and highway as the place of occurrence of the external cause: Secondary | ICD-10-CM | POA: Diagnosis not present

## 2018-10-28 DIAGNOSIS — S060X1A Concussion with loss of consciousness of 30 minutes or less, initial encounter: Secondary | ICD-10-CM | POA: Diagnosis not present

## 2018-10-28 DIAGNOSIS — S0990XA Unspecified injury of head, initial encounter: Secondary | ICD-10-CM | POA: Diagnosis present

## 2018-10-28 MED ORDER — TRAMADOL HCL 50 MG PO TABS: 50.0000 mg | ORAL_TABLET | Freq: Four times a day (QID) | ORAL | 0 refills | Status: AC | PRN

## 2018-10-28 MED ORDER — TRAMADOL HCL 50 MG PO TABS
50.0000 mg | ORAL_TABLET | Freq: Once | ORAL | Status: AC
  Administered 2018-10-28: 13:00:00 50 mg via ORAL
  Filled 2018-10-28: qty 1
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
## 2018-10-28 NOTE — ED Notes (Signed)
Patient transported to X-ray 

## 2018-10-28 NOTE — ED Notes (Signed)
NAD noted at time of D/C. Pt denies questions or concerns. Pt ambulatory to the lobby at this time.  
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
## 2018-11-03 ENCOUNTER — Other Ambulatory Visit: Payer: Self-pay

## 2018-11-03 DIAGNOSIS — Z20822 Contact with and (suspected) exposure to covid-19: Secondary | ICD-10-CM

## 2018-11-05 LAB — NOVEL CORONAVIRUS, NAA: SARS-CoV-2, NAA: NOT DETECTED

## 2019-07-01 ENCOUNTER — Other Ambulatory Visit: Payer: Self-pay

## 2019-07-01 ENCOUNTER — Emergency Department
Admission: EM | Admit: 2019-07-01 | Discharge: 2019-07-01 | Disposition: A | Discharge status: Home / Self Care | Payer: Self-pay | Attending: Emergency Medicine | Admitting: Emergency Medicine

## 2019-07-01 ENCOUNTER — Emergency Department: Payer: Self-pay

## 2019-07-01 ENCOUNTER — Encounter: Payer: Self-pay | Admitting: Emergency Medicine

## 2019-07-01 DIAGNOSIS — W228XXA Striking against or struck by other objects, initial encounter: Secondary | ICD-10-CM | POA: Insufficient documentation

## 2019-07-01 DIAGNOSIS — Y999 Unspecified external cause status: Secondary | ICD-10-CM | POA: Insufficient documentation

## 2019-07-01 DIAGNOSIS — Z79899 Other long term (current) drug therapy: Secondary | ICD-10-CM | POA: Insufficient documentation

## 2019-07-01 DIAGNOSIS — S0083XA Contusion of other part of head, initial encounter: Secondary | ICD-10-CM | POA: Insufficient documentation

## 2019-07-01 DIAGNOSIS — Y939 Activity, unspecified: Secondary | ICD-10-CM | POA: Insufficient documentation

## 2019-07-01 DIAGNOSIS — Y929 Unspecified place or not applicable: Secondary | ICD-10-CM | POA: Insufficient documentation

## 2019-07-01 DIAGNOSIS — S0292XA Unspecified fracture of facial bones, initial encounter for closed fracture: Secondary | ICD-10-CM | POA: Insufficient documentation

## 2019-07-01 DIAGNOSIS — F17200 Nicotine dependence, unspecified, uncomplicated: Secondary | ICD-10-CM | POA: Insufficient documentation

## 2019-07-01 MED ORDER — CEPHALEXIN 500 MG PO CAPS: 500.0000 mg | ORAL_CAPSULE | Freq: Three times a day (TID) | ORAL | 0 refills | Status: AC

## 2019-07-01 MED ORDER — OXYCODONE-ACETAMINOPHEN 5-325 MG PO TABS: 1.0000 | ORAL_TABLET | ORAL | 0 refills | Status: AC | PRN
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
## 2019-07-01 NOTE — Discharge Instructions (Signed)
1.  Take antibiotic as prescribed (Keflex 500 mg 3 times daily x7 days). 2.  Take pain medicine as needed (Percocet #30). 3.  Apply ice to affected area several times daily. 4.  Return to the ER for worsening symptoms, persistent vomiting, difficulty breathing or other concerns.
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
